# Patient Record
Sex: Female | Born: 1980 | Race: Black or African American | Hispanic: No | Marital: Single | Smoking: Current every day smoker
Health system: Southern US, Community
[De-identification: ages and names within clinical notes are randomized; demographics above are authoritative.]

---

## 2015-01-12 ENCOUNTER — Encounter (HOSPITAL_COMMUNITY): Payer: Self-pay | Admitting: Emergency Medicine

## 2015-01-12 ENCOUNTER — Emergency Department (HOSPITAL_COMMUNITY)
Admission: EM | Admit: 2015-01-12 | Discharge: 2015-01-12 | Disposition: A | Discharge status: Home / Self Care | Payer: Medicaid Other | Attending: Emergency Medicine | Admitting: Emergency Medicine

## 2015-01-12 DIAGNOSIS — H9203 Otalgia, bilateral: Secondary | ICD-10-CM | POA: Insufficient documentation

## 2015-01-12 DIAGNOSIS — H748X3 Other specified disorders of middle ear and mastoid, bilateral: Secondary | ICD-10-CM | POA: Diagnosis not present

## 2015-01-12 DIAGNOSIS — F17210 Nicotine dependence, cigarettes, uncomplicated: Secondary | ICD-10-CM | POA: Diagnosis not present

## 2015-01-12 DIAGNOSIS — J029 Acute pharyngitis, unspecified: Secondary | ICD-10-CM | POA: Insufficient documentation

## 2015-01-12 DIAGNOSIS — R05 Cough: Secondary | ICD-10-CM

## 2015-01-12 DIAGNOSIS — R059 Cough, unspecified: Secondary | ICD-10-CM

## 2015-01-12 MED ORDER — AMOXICILLIN 500 MG PO CAPS: 500.0000 mg | ORAL_CAPSULE | Freq: Three times a day (TID) | ORAL | Status: AC

## 2015-01-12 NOTE — ED Provider Notes (Signed)
CSN: [id number]     Arrival date & time 01/12/15  1853 History  By signing my name below, I, Freida Busman, attest that this documentation has been prepared under the direction and in the presence of non-physician practitioner, Sharilyn Sites, PA-C. Electronically Signed: Freida Busman, Scribe. 01/12/2015. 7:33 PM.   Chief Complaint  Patient presents with  . Sore Throat   The history is provided by the patient. No language interpreter was used.    HPI Comments:  Kara Coleman is a 34 y.o. female who presents to the Emergency Department complaining of sore throat with moderate pain, onset a few days ago. She reports associated ear pain and cough, occasionally productive with sputum. Pt also notes multiple sick contacts at home. She denies fever. No alleviating factors noted.  Patient is a daily smoker.  Slight tachycardia noted.  History reviewed. No pertinent past medical history. History reviewed. No pertinent past surgical history. No family history on file. Social History  Substance Use Topics  . Smoking status: Current Every Day Smoker    Types: Cigarettes  . Smokeless tobacco: None  . Alcohol Use: No   OB History    No data available     Review of Systems  Constitutional: Negative for fever and chills.  HENT: Positive for ear pain and sore throat.   Respiratory: Positive for cough. Negative for shortness of breath.   Cardiovascular: Negative for chest pain.  All other systems reviewed and are negative.   Allergies  Review of patient's allergies indicates no known allergies.  Home Medications   Prior to Admission medications   Medication Sig Start Date End Date Taking? Authorizing Provider  amoxicillin (AMOXIL) 500 MG capsule Take 1 capsule (500 mg total) by mouth 3 (three) times daily. 01/12/15   Garlon Hatchet, PA-C   BP 130/79 mmHg  Pulse 122  Temp(Src) 98.6 F (37 C) (Oral)  Resp 18  SpO2 95%  LMP 12/20/2014 (Approximate)   Physical Exam  Constitutional:  She is oriented to person, place, and time. She appears well-developed and well-nourished. No distress.  HENT:  Head: Normocephalic and atraumatic.  Right Ear: Tympanic membrane is erythematous. A middle ear effusion is present.  Left Ear: Tympanic membrane is erythematous. A middle ear effusion is present.  Nose: Nose normal.  Mouth/Throat: Uvula is midline and mucous membranes are normal. Normal dentition. Posterior oropharyngeal erythema present.  Tonsils 2+ bilaterally with exudates noted; uvula midline without peritonsillar abscess; handling secretions appropriately; no difficulty swallowing or speaking EAC's and TM's erythematous bilaterally; no TM rupture or mastoid tenderness  Eyes: Conjunctivae and EOM are normal. Pupils are equal, round, and reactive to light.  Neck: Normal range of motion. Neck supple.  Cardiovascular: Normal rate, regular rhythm and normal heart sounds.   Pulmonary/Chest: Effort normal and breath sounds normal. She has no decreased breath sounds. She has no wheezes. She has no rhonchi.  Musculoskeletal: Normal range of motion. She exhibits no edema.  Neurological: She is alert and oriented to person, place, and time.  Skin: Skin is warm and dry. She is not diaphoretic.  Psychiatric: She has a normal mood and affect.  Nursing note and vitals reviewed.   ED Course  Procedures   DIAGNOSTIC STUDIES:  Oxygen Saturation is 95% on RA, normal by my interpretation.    COORDINATION OF CARE:  7:30 PM Discussed treatment plan with pt at bedside and pt agreed to plan.   MDM   Final diagnoses:  Sore throat  Ear pain, bilateral  Cough   34 year old female here with sore throat, bilateral ear pain, and intermittently productive cough. Patient is afebrile, nontoxic.  She is somewhat tachycardic but denies chest pain or shortness of breath. Her lungs are clear without wheezes or rhonchi to suggest pneumonia. Her tonsils are enlarged bilaterally with exudates noted.  She also has findings concerning for otitis media. Patient be started on amoxicillin for coverage of both.  Discussed plan with patient, he/she acknowledged understanding and agreed with plan of care.  Return precautions given for new or worsening symptoms.  I personally performed the services described in this documentation, which was scribed in my presence. The recorded information has been reviewed and is accurate.  Garlon HatchetLisa M Mazzy Santarelli, PA-C 01/12/15 2111  Geoffery Lyonsouglas Delo, MD 01/12/15 [phone]

## 2015-01-12 NOTE — ED Notes (Signed)
Pt arrives POV with c/o of sore throat, cough and ear pain. States she originally thought it was an ear infection, but developed cough - intermittently productive

## 2015-01-12 NOTE — ED Notes (Signed)
Patient left at this time with all belongings. 

## 2015-01-12 NOTE — Discharge Instructions (Signed)
Take the prescribed medication as directed. °Return to the ED for new or worsening symptoms. ° °

## 2015-02-17 ENCOUNTER — Emergency Department (HOSPITAL_COMMUNITY)
Admission: EM | Admit: 2015-02-17 | Discharge: 2015-02-17 | Disposition: A | Discharge status: Home / Self Care | Payer: Medicaid Other | Attending: Emergency Medicine | Admitting: Emergency Medicine

## 2015-02-17 ENCOUNTER — Encounter (HOSPITAL_COMMUNITY): Payer: Self-pay | Admitting: Emergency Medicine

## 2015-02-17 DIAGNOSIS — K0889 Other specified disorders of teeth and supporting structures: Secondary | ICD-10-CM | POA: Insufficient documentation

## 2015-02-17 DIAGNOSIS — Z792 Long term (current) use of antibiotics: Secondary | ICD-10-CM | POA: Insufficient documentation

## 2015-02-17 DIAGNOSIS — R51 Headache: Secondary | ICD-10-CM | POA: Insufficient documentation

## 2015-02-17 DIAGNOSIS — K029 Dental caries, unspecified: Secondary | ICD-10-CM | POA: Diagnosis not present

## 2015-02-17 DIAGNOSIS — F17210 Nicotine dependence, cigarettes, uncomplicated: Secondary | ICD-10-CM | POA: Insufficient documentation

## 2015-02-17 MED ORDER — HYDROCODONE-ACETAMINOPHEN 5-325 MG PO TABS: 1.0000 | ORAL_TABLET | Freq: Four times a day (QID) | ORAL | Status: DC | PRN

## 2015-02-17 MED ORDER — HYDROCODONE-ACETAMINOPHEN 5-325 MG PO TABS
1.0000 | ORAL_TABLET | Freq: Once | ORAL | Status: AC
  Administered 2015-02-17: 1 via ORAL
  Filled 2015-02-17: qty 1

## 2015-02-17 MED ORDER — BUPIVACAINE-EPINEPHRINE (PF) 0.5% -1:200000 IJ SOLN
1.8000 mL | Freq: Once | INTRAMUSCULAR | Status: AC
  Administered 2015-02-17: 1.8 mL

## 2015-02-17 MED ORDER — PENICILLIN V POTASSIUM 500 MG PO TABS: 500.0000 mg | ORAL_TABLET | Freq: Three times a day (TID) | ORAL | Status: AC

## 2015-02-17 NOTE — ED Notes (Signed)
Pt c/o of dental pain. Swishing cold water continuously. Symptoms have been continuous off and on for several month. Worsening progression over last 4 days. Went to dentist 2 years ago for the same problem. She was told that she has to get all of her back teeth taken out but that she had to stop smoking first. Pt states she is ready to quit. Painful at the top and the bottom.

## 2015-02-17 NOTE — Discharge Instructions (Signed)
Please follow up closely with dentist for further management of your dental pain.  Take antibiotic, pain medication and ibuprofen for your symptoms.    Dental Caries Dental caries (also called tooth decay) is the most common oral disease. It can occur at any age but is more common in children and young adults.  HOW DENTAL CARIES DEVELOPS  The process of decay begins when bacteria and foods (particularly sugars and starches) combine in your mouth to produce plaque. Plaque is a substance that sticks to the hard, outer surface of a tooth (enamel). The bacteria in plaque produce acids that attack enamel. These acids may also attack the root surface of a tooth (cementum) if it is exposed. Repeated attacks dissolve these surfaces and create holes in the tooth (cavities). If left untreated, the acids destroy the other layers of the tooth.  RISK FACTORS  Frequent sipping of sugary beverages.   Frequent snacking on sugary and starchy foods, especially those that easily get stuck in the teeth.   Poor oral hygiene.   Dry mouth.   Substance abuse such as methamphetamine abuse.   Broken or poor-fitting dental restorations.   Eating disorders.   Gastroesophageal reflux disease (GERD).   Certain radiation treatments to the head and neck. SYMPTOMS In the early stages of dental caries, symptoms are seldom present. Sometimes white, chalky areas may be seen on the enamel or other tooth layers. In later stages, symptoms may include:  Pits and holes on the enamel.  Toothache after sweet, hot, or cold foods or drinks are consumed.  Pain around the tooth.  Swelling around the tooth. DIAGNOSIS  Most of the time, dental caries is detected during a regular dental checkup. A diagnosis is made after a thorough medical and dental history is taken and the surfaces of your teeth are checked for signs of dental caries. Sometimes special instruments, such as lasers, are used to check for dental caries.  Dental X-ray exams may be taken so that areas not visible to the eye (such as between the contact areas of the teeth) can be checked for cavities.  TREATMENT  If dental caries is in its early stages, it may be reversed with a fluoride treatment or an application of a remineralizing agent at the dental office. Thorough brushing and flossing at home is needed to aid these treatments. If it is in its later stages, treatment depends on the location and extent of tooth destruction:   If a small area of the tooth has been destroyed, the destroyed area will be removed and cavities will be filled with a material such as gold, silver amalgam, or composite resin.   If a large area of the tooth has been destroyed, the destroyed area will be removed and a cap (crown) will be fitted over the remaining tooth structure.   If the center part of the tooth (pulp) is affected, a procedure called a root canal will be needed before a filling or crown can be placed.   If most of the tooth has been destroyed, the tooth may need to be pulled (extracted). HOME CARE INSTRUCTIONS You can prevent, stop, or reverse dental caries at home by practicing good oral hygiene. Good oral hygiene includes:  Thoroughly cleaning your teeth at least twice a day with a toothbrush and dental floss.   Using a fluoride toothpaste. A fluoride mouth rinse may also be used if recommended by your dentist or health care provider.   Restricting the amount of sugary and starchy  foods and sugary liquids you consume.   Avoiding frequent snacking on these foods and sipping of these liquids.   Keeping regular visits with a dentist for checkups and cleanings. PREVENTION   Practice good oral hygiene.  Consider a dental sealant. A dental sealant is a coating material that is applied by your dentist to the pits and grooves of teeth. The sealant prevents food from being trapped in them. It may protect the teeth for several years.  Ask about  fluoride supplements if you live in a community without fluorinated water or with water that has a low fluoride content. Use fluoride supplements as directed by your dentist or health care provider.  Allow fluoride varnish applications to teeth if directed by your dentist or health care provider.   This information is not intended to replace advice given to you by your health care provider. Make sure you discuss any questions you have with your health care provider.   Document Released: 10/27/2001 Document Revised: 02/25/2014 Document Reviewed: 02/07/2012 Elsevier Interactive Patient Education [employer]

## 2015-02-17 NOTE — ED Provider Notes (Signed)
CSN: [id number]   Arrival date & time 02/17/15 2253  History  By signing my name below, I, Bethel Born, attest that this documentation has been prepared under the direction and in the presence of  Fayrene Helper PA-C Electronically Signed: Bethel Born, ED Scribe. 02/17/2015. 11:46 PM. Chief Complaint  Patient presents with  . Dental Pain    HPI The history is provided by the patient. No language interpreter was used.   Kara Coleman is a 34 y.o. female who presents to the Emergency Department complaining of ongoing, throbbing, right upper and lower dental pain with onset 2 months ago.  Gargling with cold water improves the pain. Heat exacerbates the pain. Advil provides some relief at home but the pain has worsened over the last 4 days. Associated symptoms include headache. Pt denies fever, trouble swallowing, rhinorrhea, and sneezing. Pt states that she was seen by a dentist more than 1 year ago where she was told that she needed to have several teeth removed but she had to quit smoking first. She plans to see her dentist again in the morning.  History reviewed. No pertinent past medical history.  History reviewed. No pertinent past surgical history.  No family history on file.  Social History  Substance Use Topics  . Smoking status: Current Every Day Smoker    Types: Cigarettes  . Smokeless tobacco: None  . Alcohol Use: No     Review of Systems  Constitutional: Negative for fever.  HENT: Positive for dental problem. Negative for rhinorrhea, sneezing and trouble swallowing.   Neurological: Positive for headaches.    Home Medications   Prior to Admission medications   Medication Sig Start Date End Date Taking? Authorizing Provider  amoxicillin (AMOXIL) 500 MG capsule Take 1 capsule (500 mg total) by mouth 3 (three) times daily. 01/12/15   Garlon Hatchet, PA-C  HYDROcodone-acetaminophen (NORCO/VICODIN) 5-325 MG tablet Take 1 tablet by mouth every 6 (six) hours as needed for  moderate pain. 02/17/15   Fayrene Helper, PA-C  penicillin v potassium (VEETID) 500 MG tablet Take 1 tablet (500 mg total) by mouth 3 (three) times daily. 02/17/15   Fayrene Helper, PA-C    Allergies  Review of patient's allergies indicates no known allergies.  Triage Vitals: BP 184/92 mmHg  Pulse 92  Temp(Src) 98.3 F (36.8 C) (Axillary)  Resp 24  SpO2 100%  Physical Exam  Constitutional: She is oriented to person, place, and time. She appears well-developed and well-nourished. No distress.  HENT:  Head: Normocephalic and atraumatic.  Significant dental decay noted at tooth #2, #30, and #31 with tenderness to palpation. No gingival erythema. No obvious abscess. No trismus.   Eyes: Conjunctivae and EOM are normal.  Neck: Neck supple. No tracheal deviation present.  Cardiovascular: Normal rate.   Pulmonary/Chest: Effort normal. No respiratory distress.  Musculoskeletal: Normal range of motion.  Lymphadenopathy:    She has no cervical adenopathy.  Neurological: She is alert and oriented to person, place, and time.  Skin: Skin is warm and dry.  Psychiatric: She has a normal mood and affect. Her behavior is normal.  Nursing note and vitals reviewed.   ED Course  Procedures  Dental Block Procedure Note Performed by:Fayrene Helper PA-C at 11:38 PM Consent obtained.  Required items: required blood products, implants, devices, and special equipment available. Time out: Immediately prior to the procedure a "time out" was called to verify the correct patient, procedure, equipment, support staff, and site/side marked as required. Indication: Pain improvement Nerve  block body site: Inferior alveolar Needle gauge: 27 Location technique:anatomical landmarks Local anesthetic: Marcaine Anesthetic total:1.8 ml Outcome: Pain improvement Patient tolerance: Patient tolerated the procedure well with no immediate complications.   DIAGNOSTIC STUDIES: Oxygen Saturation is 100% on RA,  normal by my  interpretation.    COORDINATION OF CARE: 11:33 PM Discussed treatment plan which includes dental block and discharge with abx to f/u with her dentist with pt at bedside and pt agreed to the plan.  MDM  Patient with dentalgia. Dental block performed in the ED. No abscess requiring immediate incision and drainage.  Exam not concerning for Ludwig's angina or pharyngeal abscess.  Will treat with penicillin v potassium . Pt instructed to follow-up with dentist.  Discussed return precautions. Pt safe for discharge.  Final diagnoses:  Pain due to dental caries    BP 165/85 mmHg  Pulse 88  Temp(Src) 98.3 F (36.8 C) (Axillary)  Resp 22  Ht 5\' 4"  (1.626 m)  Wt 84.369 kg  BMI 31.91 kg/m2  SpO2 100% . I personally performed the services described in this documentation, which was scribed in my presence. The recorded information has been reviewed and is accurate.        Fayrene HelperBowie Albana Saperstein, PA-C 02/17/15 2351  Mancel BaleElliott Wentz, MD 02/18/15 [phone]

## 2015-07-17 ENCOUNTER — Encounter (HOSPITAL_COMMUNITY): Payer: Self-pay | Admitting: Emergency Medicine

## 2015-07-17 ENCOUNTER — Emergency Department (HOSPITAL_COMMUNITY)
Admission: EM | Admit: 2015-07-17 | Discharge: 2015-07-17 | Disposition: A | Discharge status: Home / Self Care | Payer: Medicaid Other | Attending: Emergency Medicine | Admitting: Emergency Medicine

## 2015-07-17 ENCOUNTER — Emergency Department (HOSPITAL_COMMUNITY): Payer: Medicaid Other

## 2015-07-17 DIAGNOSIS — Y9289 Other specified places as the place of occurrence of the external cause: Secondary | ICD-10-CM | POA: Diagnosis not present

## 2015-07-17 DIAGNOSIS — W010XXA Fall on same level from slipping, tripping and stumbling without subsequent striking against object, initial encounter: Secondary | ICD-10-CM | POA: Diagnosis not present

## 2015-07-17 DIAGNOSIS — F17210 Nicotine dependence, cigarettes, uncomplicated: Secondary | ICD-10-CM | POA: Insufficient documentation

## 2015-07-17 DIAGNOSIS — S82832A Other fracture of upper and lower end of left fibula, initial encounter for closed fracture: Secondary | ICD-10-CM | POA: Diagnosis not present

## 2015-07-17 DIAGNOSIS — Y9302 Activity, running: Secondary | ICD-10-CM | POA: Insufficient documentation

## 2015-07-17 DIAGNOSIS — Z792 Long term (current) use of antibiotics: Secondary | ICD-10-CM | POA: Diagnosis not present

## 2015-07-17 DIAGNOSIS — S99912A Unspecified injury of left ankle, initial encounter: Secondary | ICD-10-CM | POA: Diagnosis present

## 2015-07-17 DIAGNOSIS — Y998 Other external cause status: Secondary | ICD-10-CM | POA: Insufficient documentation

## 2015-07-17 DIAGNOSIS — S82402A Unspecified fracture of shaft of left fibula, initial encounter for closed fracture: Secondary | ICD-10-CM

## 2015-07-17 MED ORDER — HYDROCODONE-ACETAMINOPHEN 5-325 MG PO TABS: 2.0000 | ORAL_TABLET | ORAL | Status: AC | PRN

## 2015-07-17 NOTE — ED Notes (Signed)
Declined W/C at D/C and was escorted to lobby by RN. 

## 2015-07-17 NOTE — Discharge Instructions (Signed)
Cast or Splint Care °Casts and splints support injured limbs and keep bones from moving while they heal. It is important to care for your cast or splint at home.   °HOME CARE INSTRUCTIONS °· Keep the cast or splint uncovered during the drying period. It can take 24 to 48 hours to dry if it is made of plaster. A fiberglass cast will dry in less than 1 hour. °· Do not rest the cast on anything harder than a pillow for the first 24 hours. °· Do not put weight on your injured limb or apply pressure to the cast until your health care provider gives you permission. °· Keep the cast or splint dry. Wet casts or splints can lose their shape and may not support the limb as well. A wet cast that has lost its shape can also create harmful pressure on your skin when it dries. Also, wet skin can become infected. °· Cover the cast or splint with a plastic bag when bathing or when out in the rain or snow. If the cast is on the trunk of the body, take sponge baths until the cast is removed. °· If your cast does become wet, dry it with a towel or a blow dryer on the cool setting only. °· Keep your cast or splint clean. Soiled casts may be wiped with a moistened cloth. °· Do not place any hard or soft foreign objects under your cast or splint, such as cotton, toilet paper, lotion, or powder. °· Do not try to scratch the skin under the cast with any object. The object could get stuck inside the cast. Also, scratching could lead to an infection. If itching is a problem, use a blow dryer on a cool setting to relieve discomfort. °· Do not trim or cut your cast or remove padding from inside of it. °· Exercise all joints next to the injury that are not immobilized by the cast or splint. For example, if you have a long leg cast, exercise the hip joint and toes. If you have an arm cast or splint, exercise the shoulder, elbow, thumb, and fingers. °· Elevate your injured arm or leg on 1 or 2 pillows for the first 1 to 3 days to decrease  swelling and pain. It is best if you can comfortably elevate your cast so it is higher than your heart. °SEEK MEDICAL CARE IF:  °· Your cast or splint cracks. °· Your cast or splint is too tight or too loose. °· You have unbearable itching inside the cast. °· Your cast becomes wet or develops a soft spot or area. °· You have a bad smell coming from inside your cast. °· You get an object stuck under your cast. °· Your skin around the cast becomes red or raw. °· You have new pain or worsening pain after the cast has been applied. °SEEK IMMEDIATE MEDICAL CARE IF:  °· You have fluid leaking through the cast. °· You are unable to move your fingers or toes. °· You have discolored (blue or white), cool, painful, or very swollen fingers or toes beyond the cast. °· You have tingling or numbness around the injured area. °· You have severe pain or pressure under the cast. °· You have any difficulty with your breathing or have shortness of breath. °· You have chest pain. °  °This information is not intended to replace advice given to you by your health care provider. Make sure you discuss any questions you have with your health care   provider. °  °Document Released: 02/02/2000 Document Revised: 11/25/2012 Document Reviewed: 08/13/2012 °Elsevier Interactive Patient Education ©2016 [employer] ° °Fibular Ankle Fracture Treated With or Without Immobilization, Adult °A fibular fracture at your ankle is a break (fracture) bone in the smallest of the two bones in your lower leg, located on the outside of your leg (fibula) close to the area at your ankle joint. °CAUSES °· Rolling your ankle. °· Twisting your ankle. °· Extreme flexing or extending of your foot. °· Severe force on your ankle as when falling from a distance. °RISK FACTORS °· Jumping activities. °· Participation in sports. °· Osteoporosis. °· Advanced age. °· Previous ankle injuries. °SIGNS AND SYMPTOMS °· Pain. °· Swelling. °· Inability to put weight on injured  ankle. °· Bruising. °· Bone deformities at site of injury. °DIAGNOSIS  °This fracture is diagnosed with the help of an X-ray exam. °TREATMENT  °If the fractured bone did not move out of place it usually will heal without problems and does casting or splinting. If immobilization is needed for comfort or the fractured bone moved out of place and will not heal properly with immobilization, a cast or splint will be used. °HOME CARE INSTRUCTIONS  °· Apply ice to the area of injury: °¨ Put ice in a plastic bag. °¨ Place a towel between your skin and the bag. °¨ Leave the ice on for 20 minutes, 2-3 times a day. °· Use crutches as directed. Resume walking without crutches as directed by your health care provider. °· Only take over-the-counter or prescription medicines for pain, discomfort, or fever as directed by your health care provider. °· If you have a removable splint or boot, do not remove the boot unless directed by your health care provider. °SEEK MEDICAL CARE IF:  °· You have continued pain or more swelling °· The medications do not control the pain. °SEEK IMMEDIATE MEDICAL CARE IF: °· You develop severe pain in the leg or foot. °· Your skin or nails below the injury turn blue or grey or feel cold or numb. °MAKE SURE YOU:  °· Understand these instructions. °· Will watch your condition. °· Will get help right away if you are not doing well or get worse. °  °This information is not intended to replace advice given to you by your health care provider. Make sure you discuss any questions you have with your health care provider. °  °Document Released: 02/04/2005 Document Revised: 02/25/2014 Document Reviewed: 09/16/2012 °Elsevier Interactive Patient Education ©2016 [employer] ° °

## 2015-07-17 NOTE — ED Notes (Signed)
Pt reports that she was running when she fell causing injury to left ankle.

## 2015-07-17 NOTE — ED Provider Notes (Signed)
CSN: [id number]     Arrival date & time 07/17/15  [id number] History   First MD Initiated Contact with Patient 07/17/15 [phone]     Chief Complaint  Patient presents with  . Fall  . Ankle Pain   HPI   35 year old female presents today with complaints of ankle pain. Patient reports that she was walking when she slipped on water approximately 5 days ago. Patient reports that she thought she just sprained her ankle, and has been able to ambulate but with pain since then. She notes swelling to the lateral aspect of the ankle, denies any loss of distal sensation strength or motor function. Patient reports that her range of motion is decreased due to the swelling, she denies any other injuries, denies any proximal fibular injury. No history of the same, patient has been using ibuprofen for pain.  History reviewed. No pertinent past medical history. History reviewed. No pertinent past surgical history. No family history on file. Social History  Substance Use Topics  . Smoking status: Current Every Day Smoker    Types: Cigarettes  . Smokeless tobacco: None  . Alcohol Use: No   OB History    No data available     Review of Systems  All other systems reviewed and are negative.   Allergies  Review of patient's allergies indicates no known allergies.  Home Medications   Prior to Admission medications   Medication Sig Start Date End Date Taking? Authorizing Provider  amoxicillin (AMOXIL) 500 MG capsule Take 1 capsule (500 mg total) by mouth 3 (three) times daily. 01/12/15   Garlon HatchetLisa M Sanders, PA-C  HYDROcodone-acetaminophen (NORCO/VICODIN) 5-325 MG tablet Take 2 tablets by mouth every 4 (four) hours as needed. 07/17/15   Eyvonne MechanicJeffrey Inaki Vantine, PA-C  penicillin v potassium (VEETID) 500 MG tablet Take 1 tablet (500 mg total) by mouth 3 (three) times daily. 02/17/15   Fayrene HelperBowie Tran, PA-C   BP 129/93 mmHg  Pulse 78  Temp(Src) 98.6 F (37 C) (Oral)  Resp 18  Ht 5\' 4"  (1.626 m)  Wt 92.987 kg  BMI 35.17 kg/m2   SpO2 100%  LMP 06/26/2015   Physical Exam  Constitutional: She is oriented to person, place, and time. She appears well-developed and well-nourished.  HENT:  Head: Normocephalic and atraumatic.  Eyes: Conjunctivae are normal. Pupils are equal, round, and reactive to light. Right eye exhibits no discharge. Left eye exhibits no discharge. No scleral icterus.  Neck: Normal range of motion. No JVD present. No tracheal deviation present.  Pulmonary/Chest: Effort normal. No stridor.  Musculoskeletal: Normal range of motion.  Obvious swelling to the left lateral ankle, no obvious laxity; difficult exam due to swelling and pain. No perineal involvement, full sensation, knee nontender to palpation, lower extremity nontender no swelling. Patient ambulates with some difficulty. Pedal pulses 2+  Neurological: She is alert and oriented to person, place, and time. Coordination normal.  Psychiatric: She has a normal mood and affect. Her behavior is normal. Judgment and thought content normal.  Nursing note and vitals reviewed.   ED Course  Procedures (including critical care time) Labs Review Labs Reviewed - No data to display  Imaging Review Dg Ankle Complete Left  07/17/2015  CLINICAL DATA:  Twisting injury 4 days ago with persistent pain, initial encounter EXAM: LEFT ANKLE COMPLETE - 3+ VIEW COMPARISON:  None. FINDINGS: Generalized soft tissue swelling is noted. An undisplaced oblique fracture of the distal fibula is noted. No other fractures are noted. IMPRESSION: Distal fibular fracture with associated  soft tissue swelling. Electronically Signed   By: Alcide Clever M.D.   On: 07/17/2015 09:01   I have personally reviewed and evaluated these images and lab results as part of my medical decision-making.   EKG Interpretation None      MDM   Final diagnoses:  Closed fibular fracture, left, initial encounter    Labs:  Imaging:DG ankle complete: Distal fibular fracture with associated soft  tissue swelling  Consults:  Therapeutics:  Discharge Meds: Hydrocodone  Assessment/Plan: 35 year old female presents today with fibular fracture, no signs of intra-articular, no displacement, no perineal involvement, no other injuries noted. Patient placed in stirrup splint, given crutches, pain medication as needed, follow up with [place] [facility] this week for reevaluation. Strict return precautions given        Eyvonne Mechanic, PA-C 07/17/15 1353  Rolland Porter, MD 07/27/15 [phone]

## 2015-07-17 NOTE — Progress Notes (Signed)
Orthopedic Tech Progress Note Patient Details:  Kara Coleman 04/09/1980 [id number]  Ortho Devices Type of Ortho Device: Ace wrap, Post (short leg) splint, Stirrup splint, Crutches Ortho Device/Splint Interventions: Application   Saul FordyceJennifer C Deanette Tullius 07/17/2015, 10:09 AM

## 2017-06-08 IMAGING — CR DG ANKLE COMPLETE 3+V*L*
3 series · 3 of 3 positions shown · non-contrast
Comparison: None.

CLINICAL DATA: Twisting injury 4 days ago with persistent pain,
initial encounter

EXAM:
LEFT ANKLE COMPLETE - 3+ VIEW

[ankle ap]
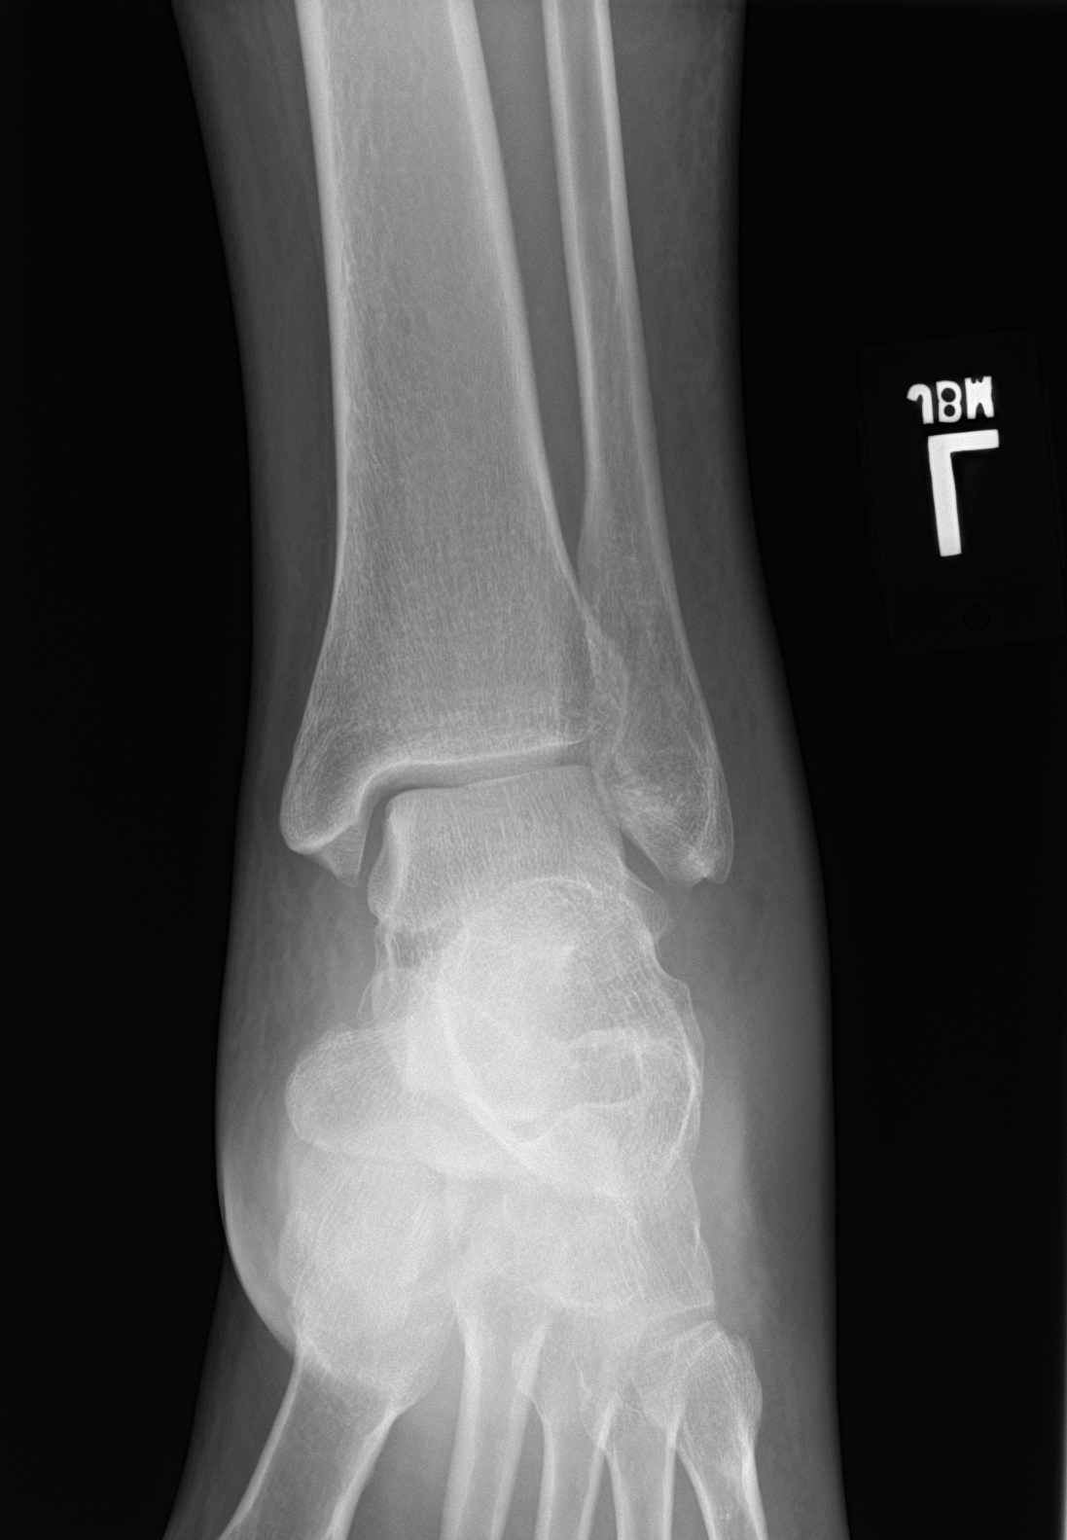

[ankle obl]
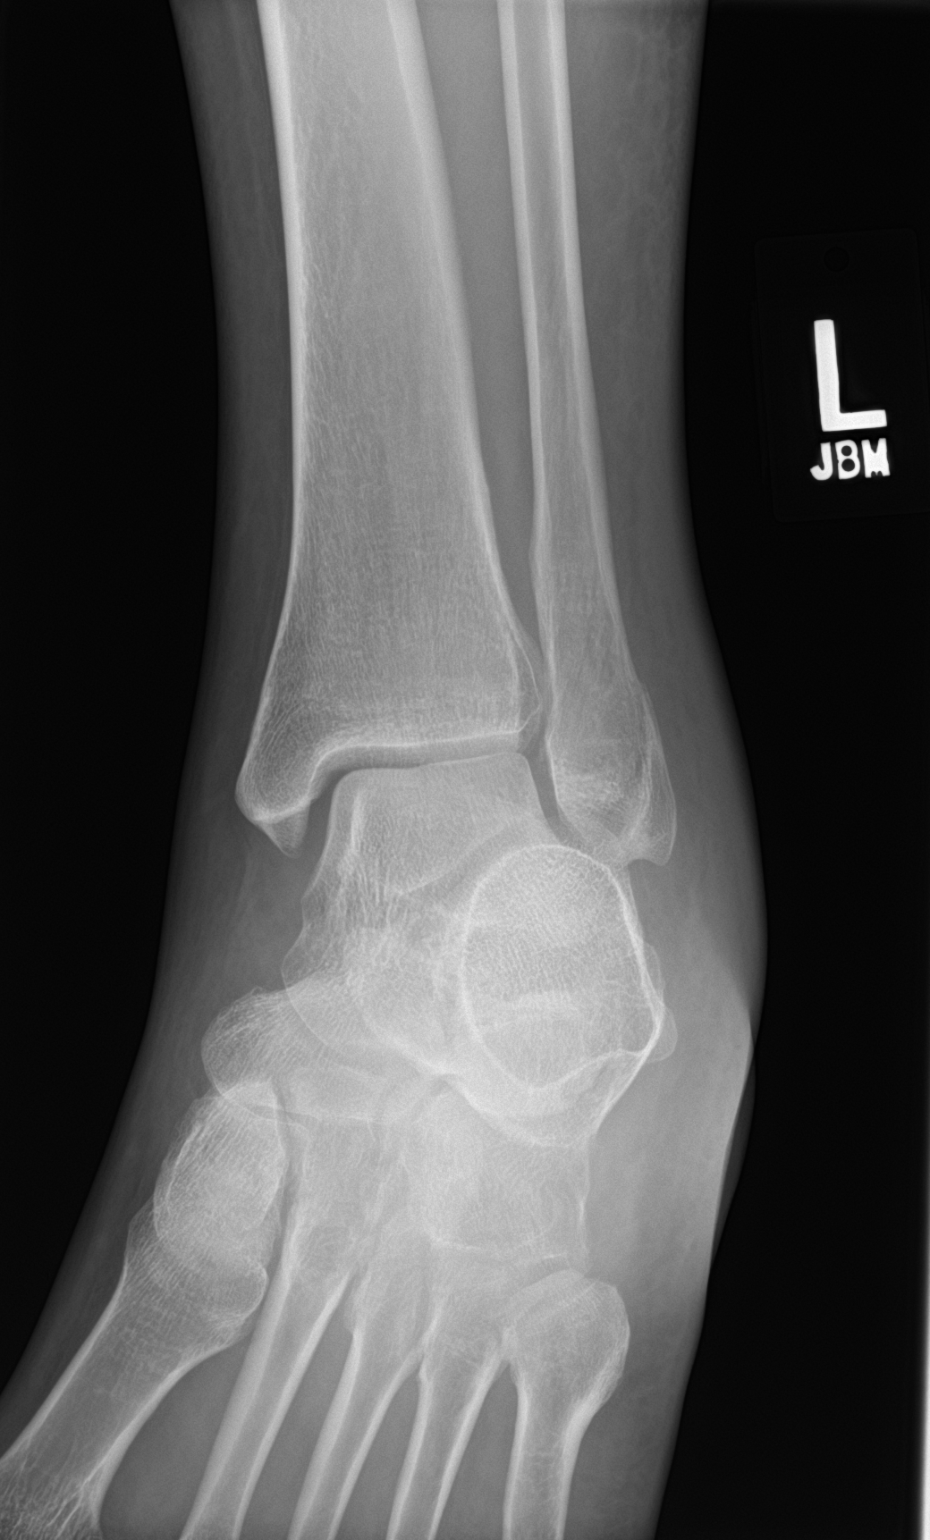

[ankle lat]
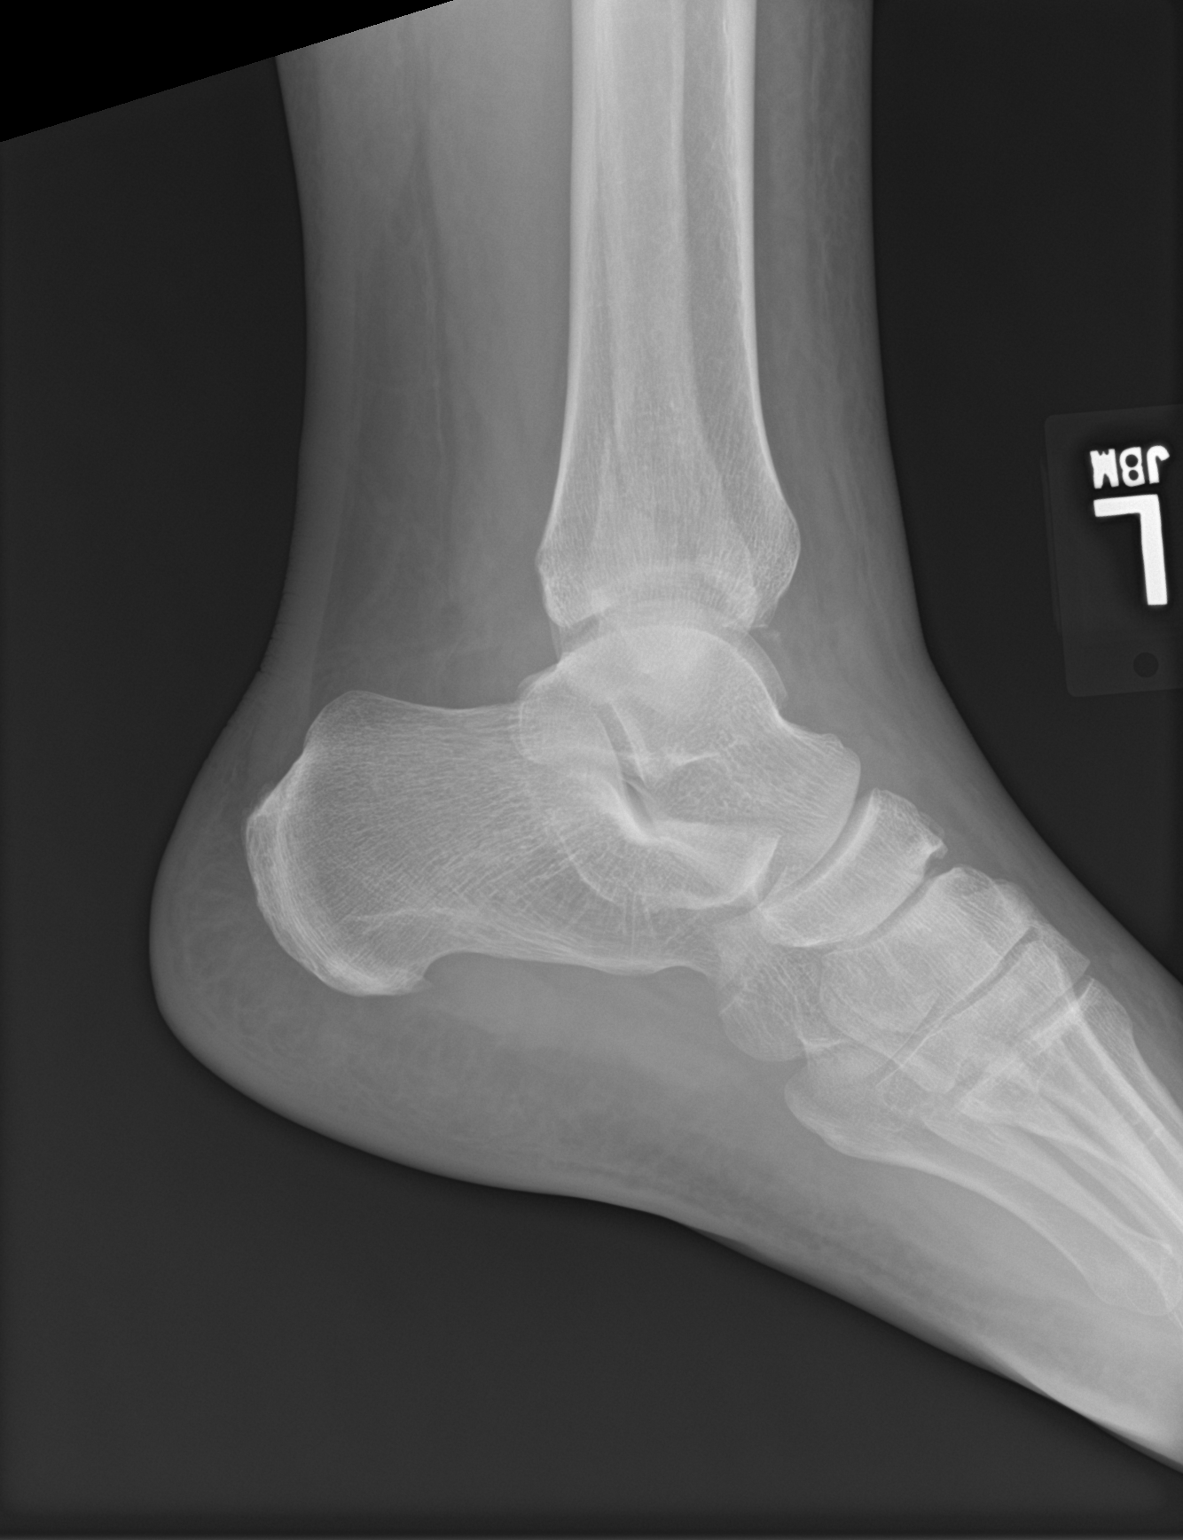

[3 of 3 positions shown; findings below may reference images not displayed]

FINDINGS: Generalized soft tissue swelling is noted. An undisplaced oblique
fracture of the distal fibula is noted. No other fractures are
noted.
IMPRESSION: Distal fibular fracture with associated soft tissue swelling.

## 2020-10-12 ENCOUNTER — Encounter (HOSPITAL_BASED_OUTPATIENT_CLINIC_OR_DEPARTMENT_OTHER): Payer: Self-pay | Admitting: Emergency Medicine

## 2020-10-12 ENCOUNTER — Other Ambulatory Visit: Payer: Self-pay

## 2020-10-12 ENCOUNTER — Emergency Department (HOSPITAL_BASED_OUTPATIENT_CLINIC_OR_DEPARTMENT_OTHER)
Admission: EM | Admit: 2020-10-12 | Discharge: 2020-10-12 | Disposition: A | Discharge status: Home / Self Care | Payer: Worker's Compensation | Attending: Emergency Medicine | Admitting: Emergency Medicine

## 2020-10-12 DIAGNOSIS — F17210 Nicotine dependence, cigarettes, uncomplicated: Secondary | ICD-10-CM | POA: Insufficient documentation

## 2020-10-12 DIAGNOSIS — S61211A Laceration without foreign body of left index finger without damage to nail, initial encounter: Secondary | ICD-10-CM | POA: Diagnosis not present

## 2020-10-12 DIAGNOSIS — W260XXA Contact with knife, initial encounter: Secondary | ICD-10-CM | POA: Insufficient documentation

## 2020-10-12 DIAGNOSIS — S60941A Unspecified superficial injury of left index finger, initial encounter: Secondary | ICD-10-CM | POA: Diagnosis present

## 2020-10-12 DIAGNOSIS — Z23 Encounter for immunization: Secondary | ICD-10-CM | POA: Insufficient documentation

## 2020-10-12 DIAGNOSIS — Y9389 Activity, other specified: Secondary | ICD-10-CM | POA: Diagnosis not present

## 2020-10-12 DIAGNOSIS — Y9289 Other specified places as the place of occurrence of the external cause: Secondary | ICD-10-CM | POA: Diagnosis not present

## 2020-10-12 MED ORDER — LIDOCAINE HCL (PF) 1 % IJ SOLN
5.0000 mL | Freq: Once | INTRAMUSCULAR | Status: AC
  Administered 2020-10-12: 5 mL
  Filled 2020-10-12: qty 5

## 2020-10-12 MED ORDER — TETANUS-DIPHTH-ACELL PERTUSSIS 5-2.5-18.5 LF-MCG/0.5 IM SUSY
0.5000 mL | PREFILLED_SYRINGE | Freq: Once | INTRAMUSCULAR | Status: AC
  Administered 2020-10-12: 0.5 mL via INTRAMUSCULAR
  Filled 2020-10-12: qty 0.5

## 2020-10-12 NOTE — Discharge Instructions (Addendum)
Please follow up with health care professional for suture removal in 6-7 days. You had 3 sutures placed.

## 2020-10-12 NOTE — ED Triage Notes (Signed)
Pt presents to ED POV. Pt c/o lac to L pointer finger. Pt reports that she tried to catch knife at work. Pt not UTD on tetanus. Bleeding still active but slow. Dressing changed in triage

## 2020-10-12 NOTE — ED Provider Notes (Addendum)
[facility] EMERGENCY DEPT Provider Note   CSN: [id number] Arrival date & time: 10/12/20  1820     History Chief Complaint  Patient presents with   Finger Injury    Kara Coleman is a 40 y.o. female.  Patient is a 40 yo female presenting to ED for finger laceration. Patient states directly prior to arrival she cut her left index finger with a chef knife. Admits to bleeding. Denies sensation or motor loss. Unknown Tdap status.   The history is provided by the patient. No language interpreter was used.  Hand Pain This is a new problem. The current episode started less than 1 hour ago. The problem occurs constantly. The problem has been gradually improving. Pertinent negatives include no chest pain and no shortness of breath. Nothing aggravates the symptoms.      History reviewed. No pertinent past medical history.  There are no problems to display for this patient.   History reviewed. No pertinent surgical history.   OB History   No obstetric history on file.     History reviewed. No pertinent family history.  Social History   Tobacco Use   Smoking status: Every Day    Types: Cigarettes  Substance Use Topics   Alcohol use: No   Drug use: Yes    Types: Marijuana    Home Medications Prior to Admission medications   Medication Sig Start Date End Date Taking? Authorizing Provider  amoxicillin (AMOXIL) 500 MG capsule Take 1 capsule (500 mg total) by mouth 3 (three) times daily. 01/12/15   Garlon Hatchet, PA-C  HYDROcodone-acetaminophen (NORCO/VICODIN) 5-325 MG tablet Take 2 tablets by mouth every 4 (four) hours as needed. 07/17/15   Hedges, Tinnie Gens, PA-C  penicillin v potassium (VEETID) 500 MG tablet Take 1 tablet (500 mg total) by mouth 3 (three) times daily. 02/17/15   Fayrene Helper, PA-C    Allergies    Patient has no known allergies.  Review of Systems   Review of Systems  Constitutional:  Negative for chills and fever.  Respiratory:  Negative  for chest tightness and shortness of breath.   Cardiovascular:  Negative for chest pain and palpitations.  Skin:  Positive for wound. Negative for color change.  Neurological:  Negative for weakness and numbness.   Physical Exam Updated Vital Signs BP (!) 154/104 (BP Location: Right Arm)   Pulse 75   Temp 98.1 F (36.7 C) (Oral)   Resp 18   Ht 5\' 5"  (1.651 m)   Wt 95.3 kg   LMP 10/02/2020   SpO2 100%   BMI 34.95 kg/m   Physical Exam Vitals and nursing note reviewed.  Constitutional:      Appearance: Normal appearance.  HENT:     Head: Normocephalic and atraumatic.  Cardiovascular:     Rate and Rhythm: Normal rate and regular rhythm.  Pulmonary:     Effort: Pulmonary effort is normal. No respiratory distress.  Skin:    Capillary Refill: Capillary refill takes less than 2 seconds.       Neurological:     General: No focal deficit present.     Mental Status: She is alert.     GCS: GCS eye subscore is 4. GCS verbal subscore is 5. GCS motor subscore is 6.     Sensory: Sensation is intact.     Motor: Motor function is intact.    ED Results / Procedures / Treatments   Labs (all labs ordered are listed, but only abnormal results are displayed)  Labs Reviewed - No data to display  EKG None  Radiology No results found.  Procedures .Marland KitchenLaceration Repair  Date/Time: 10/12/2020 9:02 PM Performed by: Franne Forts, DO Authorized by: Franne Forts, DO   Consent:    Consent obtained:  Verbal   Risks, benefits, and alternatives were discussed: yes   Universal protocol:    Procedure explained and questions answered to patient or proxy's satisfaction: yes     Immediately prior to procedure, a time out was called: yes     Patient identity confirmed:  Verbally with patient, arm band and hospital-assigned identification number Anesthesia:    Anesthesia method:  Local infiltration   Local anesthetic:  Lidocaine 1% w/o epi Laceration details:    Location:  Finger   Finger  location:  L index finger   Length (cm):  2 Skin repair:    Repair method:  Sutures   Suture technique:  Simple interrupted   Number of sutures:  2 Approximation:    Approximation:  Close Repair type:    Repair type:  Simple Post-procedure details:    Dressing:  Tube gauze   Procedure completion:  Tolerated   Medications Ordered in ED Medications  Tdap (BOOSTRIX) injection 0.5 mL (has no administration in time range)  lidocaine (PF) (XYLOCAINE) 1 % injection 5 mL (5 mLs Infiltration Given by Other 10/12/20 2017)    ED Course  I have reviewed the triage vital signs and the nursing notes.  Pertinent labs & imaging results that were available during my care of the patient were reviewed by me and considered in my medical decision making (see chart for details).    MDM Rules/Calculators/A&P                          40 yo female presenting to ED for finger laceration. Patient is Aox3, no acute distress, afebrile, with stable vitals. Physical exam demonstrates 2 cm laceration to dorsal surface of index finger on left hand. Bleeding controlled. No involvement of nail bed. Wound irrigated, no foreign bodies, pt declined xray, digital nerve block applied and 3 non-absorbable sutures placed.   Patient in no distress and overall condition improved here in the ED. Detailed discussions were had with the patient regarding current findings, and need for close f/u with PCP or health care professional for suture removal in 6-7 days.  The patient has been instructed to return immediately if the symptoms worsen in any way for re-evaluation. Patient verbalized understanding and is in agreement with current care plan. All questions answered prior to discharge.      Final Clinical Impression(s) / ED Diagnoses Final diagnoses:  Laceration of left index finger without foreign body without damage to nail, initial encounter    Rx / DC Orders ED Discharge Orders     None        Franne Forts,  DO 10/12/20 2101    Franne Forts, DO 10/12/20 2103

## 2020-11-06 ENCOUNTER — Encounter (HOSPITAL_COMMUNITY): Payer: Self-pay | Admitting: Emergency Medicine

## 2020-11-06 ENCOUNTER — Other Ambulatory Visit: Payer: Self-pay

## 2020-11-06 ENCOUNTER — Emergency Department (HOSPITAL_COMMUNITY)
Admission: EM | Admit: 2020-11-06 | Discharge: 2020-11-06 | Disposition: A | Discharge status: Home / Self Care | Payer: BC Managed Care – PPO | Attending: Emergency Medicine | Admitting: Emergency Medicine

## 2020-11-06 DIAGNOSIS — R0981 Nasal congestion: Secondary | ICD-10-CM | POA: Diagnosis present

## 2020-11-06 DIAGNOSIS — F17210 Nicotine dependence, cigarettes, uncomplicated: Secondary | ICD-10-CM | POA: Diagnosis not present

## 2020-11-06 DIAGNOSIS — U071 COVID-19: Secondary | ICD-10-CM | POA: Insufficient documentation

## 2020-11-06 DIAGNOSIS — Z20822 Contact with and (suspected) exposure to covid-19: Secondary | ICD-10-CM | POA: Diagnosis not present

## 2020-11-06 LAB — RESP PANEL BY RT-PCR (FLU A&B, COVID) ARPGX2
Influenza A by PCR: NEGATIVE
Influenza B by PCR: NEGATIVE
SARS Coronavirus 2 by RT PCR: POSITIVE — AB

## 2020-11-06 NOTE — ED Notes (Signed)
RN reviewed discharge instructions w/ pt. Follow up and pain management reviewed, pt had no further questions. 

## 2020-11-06 NOTE — ED Triage Notes (Signed)
Pt here from home for sinus pressure, chills and body aches that started yesterday morning. Pt reports some people at work have covid, she has had 1 shot, also says daughter has been sick w/ sore throat.

## 2020-11-06 NOTE — Discharge Instructions (Signed)
Please follow your results in MyChart.  Given that you have had known COVID contacts and are now having COVID-like symptoms even if this test is negative you need to assume you have COVID and quarantine appropriately.  Please take Tylenol (acetaminophen) to relieve your pain.  You may take tylenol, up to 1,000 mg (two extra strength pills).  Do not take more than 3,000 mg tylenol in a 24 hour period.  Please check all medication labels as many medications such as pain and cold medications may contain tylenol. Please do not drink alcohol while taking this medication.

## 2020-11-06 NOTE — ED Provider Notes (Signed)
[facility] EMERGENCY DEPARTMENT Provider Note   CSN: [id number] Arrival date & time: 11/06/20  1632     History Chief Complaint  Patient presents with   Generalized Body Aches    Kara Coleman is a 39 y.o. female with no pertinent past medical history who presents today for evaluation of body aches and nasal congestion.  She had a known COVID exposure with people at work having COVID.  She states her symptoms started yesterday.  She reports that she had body aches however that is improved.  Currently her only symptom is some nasal congestion.  She denies chest pain or significant shortness of breath.  No rashes.  No leg swelling.  She has had 1 COVID-vaccine.  HPI     History reviewed. No pertinent past medical history.  There are no problems to display for this patient.   History reviewed. No pertinent surgical history.   OB History   No obstetric history on file.     History reviewed. No pertinent family history.  Social History   Tobacco Use   Smoking status: Every Day    Packs/day: 1.00    Types: Cigarettes   Smokeless tobacco: Never  Substance Use Topics   Alcohol use: No   Drug use: Yes    Types: Marijuana    Home Medications Prior to Admission medications   Medication Sig Start Date End Date Taking? Authorizing Provider  amoxicillin (AMOXIL) 500 MG capsule Take 1 capsule (500 mg total) by mouth 3 (three) times daily. 01/12/15   Garlon Hatchet, PA-C  HYDROcodone-acetaminophen (NORCO/VICODIN) 5-325 MG tablet Take 2 tablets by mouth every 4 (four) hours as needed. 07/17/15   Hedges, Tinnie Gens, PA-C  penicillin v potassium (VEETID) 500 MG tablet Take 1 tablet (500 mg total) by mouth 3 (three) times daily. 02/17/15   Fayrene Helper, PA-C    Allergies    Patient has no known allergies.  Review of Systems   Review of Systems  Constitutional:  Positive for chills and fatigue. Negative for fever.  HENT:  Positive for congestion, postnasal  drip, sinus pressure and sinus pain. Negative for trouble swallowing.   Eyes:  Negative for visual disturbance.  Respiratory:  Negative for cough and shortness of breath.   Cardiovascular:  Negative for chest pain, palpitations and leg swelling.  Gastrointestinal:  Negative for abdominal pain and diarrhea.  Genitourinary:  Negative for dysuria.  Musculoskeletal:  Positive for arthralgias and myalgias.  Skin:  Negative for pallor.  Neurological:  Negative for weakness.  Psychiatric/Behavioral:  Negative for confusion.   All other systems reviewed and are negative.  Physical Exam Updated Vital Signs BP 139/78 (BP Location: Right Arm)   Pulse 89   Temp 99.2 F (37.3 C) (Oral)   Resp 18   Ht 5\' 5"  (1.651 m)   Wt 95.3 kg   LMP 10/09/2020 (Approximate)   SpO2 100%   BMI 34.95 kg/m   Physical Exam Vitals and nursing note reviewed.  Constitutional:      General: She is not in acute distress.    Appearance: She is not ill-appearing.  HENT:     Head: Normocephalic and atraumatic.  Eyes:     Conjunctiva/sclera: Conjunctivae normal.  Cardiovascular:     Rate and Rhythm: Normal rate.  Pulmonary:     Effort: Pulmonary effort is normal. No respiratory distress.     Breath sounds: No wheezing.  Abdominal:     General: There is no distension.  Musculoskeletal:  Cervical back: Normal range of motion and neck supple.     Comments: No obvious acute injury  Skin:    General: Skin is warm.  Neurological:     Mental Status: She is alert.     Comments: Awake and alert, answers all questions appropriately.  Speech is not slurred.  Psychiatric:        Mood and Affect: Mood normal.        Behavior: Behavior normal.    ED Results / Procedures / Treatments   Labs (all labs ordered are listed, but only abnormal results are displayed) Labs Reviewed  RESP PANEL BY RT-PCR (FLU A&B, COVID) ARPGX2    EKG None  Radiology No results found.  Procedures Procedures   Medications  Ordered in ED Medications - No data to display  ED Course  I have reviewed the triage vital signs and the nursing notes.  Pertinent labs & imaging results that were available during my care of the patient were reviewed by me and considered in my medical decision making (see chart for details).    MDM Rules/Calculators/A&P                           Kara Coleman was evaluated in Emergency Department on 11/06/2020 for the symptoms described in the history of present illness. She was evaluated in the context of the global COVID-19 pandemic, which necessitated consideration that the patient might be at risk for infection with the SARS-CoV-2 virus that causes COVID-19. Institutional protocols and algorithms that pertain to the evaluation of patients at risk for COVID-19 are in a state of rapid change based on information released by regulatory bodies including the CDC and federal and state organizations. These policies and algorithms were followed during the patient's care in the ED.  Patient is a otherwise healthy 40 year old woman who presents today for evaluation of nasal congestion after a known COVID exposure.  Here she is afebrile, not tachycardic or tachypneic and 100% on room air.  She denies any chest pain, significant shortness of breath, leg swelling, or rashes and primarily has congestion. COVID test is sent. She is informed that she can follow these results in MyChart.  We discussed appropriate isolation and quarantine.  Return precautions were discussed with patient who states their understanding.  At the time of discharge patient denied any unaddressed complaints or concerns.  Patient is agreeable for discharge home.  Note: Portions of this report may have been transcribed using voice recognition software. Every effort was made to ensure accuracy; however, inadvertent computerized transcription errors may be present  Final Clinical Impression(s) / ED Diagnoses Final diagnoses:   Suspected COVID-19 virus infection    Rx / DC Orders ED Discharge Orders     None        Norman Clay 11/06/20 2218    Benjiman Core, MD 11/06/20 2348

## 2022-04-07 ENCOUNTER — Ambulatory Visit
Admission: EM | Admit: 2022-04-07 | Discharge: 2022-04-07 | Disposition: A | Discharge status: Home / Self Care | Payer: No Typology Code available for payment source

## 2022-04-07 DIAGNOSIS — R1031 Right lower quadrant pain: Secondary | ICD-10-CM | POA: Diagnosis not present

## 2022-04-07 NOTE — ED Notes (Signed)
Patient is being discharged from the Urgent Care and sent to the Emergency Department via personal vehicle . Per Provider Lowella Petties, patient is in need of higher level of care due to RLQ abdominal pain. Patient is aware and verbalizes understanding of plan of care.    Vitals:   04/07/22 0927  BP: (!) 161/99  Pulse: 84  Resp: 18  Temp: 97.9 F (36.6 C)  SpO2: 96%

## 2022-04-07 NOTE — Discharge Instructions (Signed)
Due to the extent of your abdominal pain you will need immediate workup as well as possible imaging to determine cause, please go to one of the nearest emergency department for immediate evaluation

## 2022-04-07 NOTE — ED Provider Notes (Signed)
Patient presents for evaluation of right lower abdominal pain present for 3 days.  Pain is constant, fluctuates in intensity and radiates to the right lower back.  Symptoms are worsened by movement causing a sharp pain.  Has attempted use of over-the-counter arthritic medicine which has been ineffective.  Last bowel movement this morning, denies straining or constipation, diarrhea.  Denies abdominal bloating, increased gas production, nausea, vomiting, fevers, urinary symptoms, vaginal symptoms.  Daily tobacco use.  Has all organs.  Tenderness is present over the right lower quadrant and patient has become tearful during the exam when asked to lie back and is holding onto the side of the exam table to brace self from pain.  Due to extent of pain and lack of accompanying symptoms patient is being sent to the nearest emergency department for full abdominal workup, vital signs are stable at this time and she is to escort self.    Hans Eden, NP 04/07/22 1014

## 2022-04-07 NOTE — ED Triage Notes (Signed)
Pt presents with RLQ abdominal pain that radiates around to right lower back X 3 days.

## 2022-05-16 ENCOUNTER — Encounter (HOSPITAL_COMMUNITY): Payer: Self-pay

## 2022-05-16 ENCOUNTER — Other Ambulatory Visit: Payer: Self-pay

## 2022-05-16 ENCOUNTER — Emergency Department (HOSPITAL_COMMUNITY)
Admission: EM | Admit: 2022-05-16 | Discharge: 2022-05-16 | Disposition: A | Discharge status: Home / Self Care | Payer: 59 | Attending: Emergency Medicine | Admitting: Emergency Medicine

## 2022-05-16 DIAGNOSIS — M545 Low back pain, unspecified: Secondary | ICD-10-CM | POA: Insufficient documentation

## 2022-05-16 LAB — URINALYSIS, ROUTINE W REFLEX MICROSCOPIC
Bilirubin Urine: NEGATIVE
Glucose, UA: NEGATIVE mg/dL
Ketones, ur: NEGATIVE mg/dL
Leukocytes,Ua: NEGATIVE
Nitrite: NEGATIVE
Protein, ur: 30 mg/dL — AB
Specific Gravity, Urine: 1.016 (ref 1.005–1.030)
pH: 6 (ref 5.0–8.0)

## 2022-05-16 LAB — COMPREHENSIVE METABOLIC PANEL
ALT: 18 U/L (ref 0–44)
AST: 17 U/L (ref 15–41)
Albumin: 3.8 g/dL (ref 3.5–5.0)
Alkaline Phosphatase: 59 U/L (ref 38–126)
Anion gap: 13 (ref 5–15)
BUN: <5 mg/dL — ABNORMAL LOW (ref 6–20)
CO2: 21 mmol/L — ABNORMAL LOW (ref 22–32)
Calcium: 8.6 mg/dL — ABNORMAL LOW (ref 8.9–10.3)
Chloride: 102 mmol/L (ref 98–111)
Creatinine, Ser: 0.59 mg/dL (ref 0.44–1.00)
GFR, Estimated: >60 mL/min (ref >60)
Glucose, Bld: 89 mg/dL (ref 70–99)
Potassium: 3.3 mmol/L — ABNORMAL LOW (ref 3.5–5.1)
Sodium: 136 mmol/L (ref 135–145)
Total Bilirubin: 0.5 mg/dL (ref 0.3–1.2)
Total Protein: 7.5 g/dL (ref 6.5–8.1)

## 2022-05-16 LAB — CBC
HCT: 37.8 % (ref 36.0–46.0)
Hemoglobin: 12.9 g/dL (ref 12.0–15.0)
MCH: 32.6 pg (ref 26.0–34.0)
MCHC: 34.1 g/dL (ref 30.0–36.0)
MCV: 95.5 fL (ref 80.0–100.0)
Platelets: 263 10*3/uL (ref 150–400)
RBC: 3.96 MIL/uL (ref 3.87–5.11)
RDW: 12.6 % (ref 11.5–15.5)
WBC: 10.3 10*3/uL (ref 4.0–10.5)
nRBC: 0 % (ref 0.0–0.2)

## 2022-05-16 LAB — LIPASE, BLOOD: Lipase: 19 U/L (ref 11–51)

## 2022-05-16 LAB — I-STAT BETA HCG BLOOD, ED (MC, WL, AP ONLY): I-stat hCG, quantitative: <5 m[IU]/mL (ref <5)

## 2022-05-16 MED ORDER — ACETAMINOPHEN 500 MG PO TABS
1000.0000 mg | ORAL_TABLET | Freq: Once | ORAL | Status: AC
  Administered 2022-05-16: 1000 mg via ORAL
  Filled 2022-05-16: qty 2

## 2022-05-16 MED ORDER — DIAZEPAM 5 MG PO TABS
5.0000 mg | ORAL_TABLET | Freq: Once | ORAL | Status: AC
  Administered 2022-05-16: 5 mg via ORAL
  Filled 2022-05-16: qty 1

## 2022-05-16 MED ORDER — KETOROLAC TROMETHAMINE 15 MG/ML IJ SOLN
15.0000 mg | Freq: Once | INTRAMUSCULAR | Status: AC
  Administered 2022-05-16: 15 mg via INTRAMUSCULAR
  Filled 2022-05-16: qty 1

## 2022-05-16 MED ORDER — OXYCODONE HCL 5 MG PO TABS
5.0000 mg | ORAL_TABLET | Freq: Once | ORAL | Status: AC
  Administered 2022-05-16: 5 mg via ORAL
  Filled 2022-05-16: qty 1

## 2022-05-16 NOTE — ED Provider Notes (Signed)
[facility] Provider Note   CSN: [id number] Arrival date & time: 05/16/22  1613     History  No chief complaint on file.   Kara Coleman is a 42 y.o. female.  42 yo F with a chief complaints of right-sided low back pain that radiates around to the abdomen.  This has been going on for a few days.  No injury.  Denies loss of bowel or bladder denies loss of perirectal sensation.  Worse with movement palpation twisting.  She had a similar episode about a month ago.  Was seen in urgent care since then has not followed up with anyone.  Has no PCP but plans to establish one soon.        Home Medications Prior to Admission medications   Medication Sig Start Date End Date Taking? Authorizing Provider  amoxicillin (AMOXIL) 500 MG capsule Take 1 capsule (500 mg total) by mouth 3 (three) times daily. 01/12/15   Larene Pickett, PA-C  HYDROcodone-acetaminophen (NORCO/VICODIN) 5-325 MG tablet Take 2 tablets by mouth every 4 (four) hours as needed. 07/17/15   Hedges, Dellis Filbert, PA-C  penicillin v potassium (VEETID) 500 MG tablet Take 1 tablet (500 mg total) by mouth 3 (three) times daily. 02/17/15   Domenic Moras, PA-C      Allergies    Patient has no known allergies.    Review of Systems   Review of Systems  Physical Exam Updated Vital Signs BP (!) 168/98 (BP Location: Left Arm)   Pulse 92   Temp 97.9 F (36.6 C) (Oral)   Resp 20   Ht 5\' 5"  (1.651 m)   Wt 94.3 kg   SpO2 98%   BMI 34.61 kg/m  Physical Exam Vitals and nursing note reviewed.  Constitutional:      General: She is not in acute distress.    Appearance: She is well-developed. She is not diaphoretic.  HENT:     Head: Normocephalic and atraumatic.  Eyes:     Pupils: Pupils are equal, round, and reactive to light.  Cardiovascular:     Rate and Rhythm: Normal rate and regular rhythm.     Heart sounds: No murmur heard.    No friction rub. No gallop.  Pulmonary:     Effort:  Pulmonary effort is normal.     Breath sounds: No wheezing or rales.  Abdominal:     General: There is no distension.     Palpations: Abdomen is soft.     Tenderness: There is no abdominal tenderness.  Musculoskeletal:        General: No tenderness.     Cervical back: Normal range of motion and neck supple.     Comments: Discomfort with bending and twisting on exam.  No obvious midline spinal tenderness step-offs or deformities.  Pulse motor and sensation intact bilateral lower extremities.  Negative straight leg raise test.  Reflexes 2+ and equal.  No clonus.  Skin:    General: Skin is warm and dry.  Neurological:     Mental Status: She is alert and oriented to person, place, and time.  Psychiatric:        Behavior: Behavior normal.     ED Results / Procedures / Treatments   Labs (all labs ordered are listed, but only abnormal results are displayed) Labs Reviewed  COMPREHENSIVE METABOLIC PANEL - Abnormal; Notable for the following components:      Result Value   Potassium 3.3 (*)  CO2 21 (*)    BUN <5 (*)    Calcium 8.6 (*)    All other components within normal limits  URINALYSIS, ROUTINE W REFLEX MICROSCOPIC - Abnormal; Notable for the following components:   APPearance HAZY (*)    Hgb urine dipstick SMALL (*)    Protein, ur 30 (*)    Bacteria, UA RARE (*)    All other components within normal limits  LIPASE, BLOOD  CBC  I-STAT BETA HCG BLOOD, ED ([facility], [facility], [facility] ONLY)    EKG None  Radiology No results found.  Procedures Procedures    Medications Ordered in ED Medications  acetaminophen (TYLENOL) tablet 1,000 mg (has no administration in time range)  ketorolac (TORADOL) 15 MG/ML injection 15 mg (has no administration in time range)  oxyCODONE (Oxy IR/ROXICODONE) immediate release tablet 5 mg (has no administration in time range)  diazepam (VALIUM) tablet 5 mg (has no administration in time range)    ED Course/ Medical Decision Making/ A&P                              Medical Decision Making Amount and/or Complexity of Data Reviewed Labs: ordered.  Risk OTC drugs. Prescription drug management.   42 yo F with a chief complaints of right-sided back pain.  Worse with movement twisting palpation.  Most likely musculoskeletal by history and physical.  No red flags.  Will treat supportively.  PCP follow-up.   8:53 PM:  I have discussed the diagnosis/risks/treatment options with the patient.  Evaluation and diagnostic testing in the emergency department does not suggest an emergent condition requiring admission or immediate intervention beyond what has been performed at this time.  They will follow up with PCP. We also discussed returning to the ED immediately if new or worsening sx occur. We discussed the sx which are most concerning (e.g., sudden worsening pain, fever, inability to tolerate by mouth) that necessitate immediate return. Medications administered to the patient during their visit and any new prescriptions provided to the patient are listed below.  Medications given during this visit Medications  acetaminophen (TYLENOL) tablet 1,000 mg (has no administration in time range)  ketorolac (TORADOL) 15 MG/ML injection 15 mg (has no administration in time range)  oxyCODONE (Oxy IR/ROXICODONE) immediate release tablet 5 mg (has no administration in time range)  diazepam (VALIUM) tablet 5 mg (has no administration in time range)     The patient appears reasonably screen and/or stabilized for discharge and I doubt any other medical condition or other [facility] requiring further screening, evaluation, or treatment in the ED at this time prior to discharge.          Final Clinical Impression(s) / ED Diagnoses Final diagnoses:  Acute right-sided low back pain without sciatica    Rx / DC Orders ED Discharge Orders     None         Deno Etienne, DO 05/16/22 2053

## 2022-05-16 NOTE — ED Triage Notes (Signed)
Pt came in via POV d/t lower back pain that wraps around her Rt flank area & into her abd. Rates 4/10 pain while in triage, endorses has had hard time pushing BM out last 2 days & her urine looks "lighter," denies painful urination or frequency.

## 2022-05-16 NOTE — Discharge Instructions (Signed)

## 2022-05-30 ENCOUNTER — Emergency Department (HOSPITAL_COMMUNITY): Payer: 59

## 2022-05-30 ENCOUNTER — Emergency Department (HOSPITAL_COMMUNITY)
Admission: EM | Admit: 2022-05-30 | Discharge: 2022-05-30 | Disposition: A | Discharge status: Home / Self Care | Payer: 59 | Attending: Emergency Medicine | Admitting: Emergency Medicine

## 2022-05-30 ENCOUNTER — Encounter (HOSPITAL_COMMUNITY): Payer: Self-pay

## 2022-05-30 DIAGNOSIS — E876 Hypokalemia: Secondary | ICD-10-CM | POA: Insufficient documentation

## 2022-05-30 DIAGNOSIS — N9489 Other specified conditions associated with female genital organs and menstrual cycle: Secondary | ICD-10-CM | POA: Diagnosis not present

## 2022-05-30 DIAGNOSIS — R1031 Right lower quadrant pain: Secondary | ICD-10-CM | POA: Diagnosis present

## 2022-05-30 DIAGNOSIS — M461 Sacroiliitis, not elsewhere classified: Secondary | ICD-10-CM | POA: Insufficient documentation

## 2022-05-30 DIAGNOSIS — E278 Other specified disorders of adrenal gland: Secondary | ICD-10-CM | POA: Insufficient documentation

## 2022-05-30 DIAGNOSIS — K760 Fatty (change of) liver, not elsewhere classified: Secondary | ICD-10-CM | POA: Diagnosis not present

## 2022-05-30 DIAGNOSIS — N83202 Unspecified ovarian cyst, left side: Secondary | ICD-10-CM | POA: Diagnosis not present

## 2022-05-30 DIAGNOSIS — R109 Unspecified abdominal pain: Secondary | ICD-10-CM

## 2022-05-30 LAB — LIPASE, BLOOD: Lipase: 23 U/L (ref 11–51)

## 2022-05-30 LAB — URINALYSIS, ROUTINE W REFLEX MICROSCOPIC
Bilirubin Urine: NEGATIVE
Glucose, UA: NEGATIVE mg/dL
Hgb urine dipstick: NEGATIVE
Ketones, ur: NEGATIVE mg/dL
Leukocytes,Ua: NEGATIVE
Nitrite: NEGATIVE
Protein, ur: NEGATIVE mg/dL
Specific Gravity, Urine: 1.014 (ref 1.005–1.030)
pH: 5 (ref 5.0–8.0)

## 2022-05-30 LAB — CBC WITH DIFFERENTIAL/PLATELET
Abs Immature Granulocytes: 0.03 10*3/uL (ref 0.00–0.07)
Basophils Absolute: 0 10*3/uL (ref 0.0–0.1)
Basophils Relative: 0 %
Eosinophils Absolute: 0.1 10*3/uL (ref 0.0–0.5)
Eosinophils Relative: 1 %
HCT: 39.9 % (ref 36.0–46.0)
Hemoglobin: 13.3 g/dL (ref 12.0–15.0)
Immature Granulocytes: 0 %
Lymphocytes Relative: 24 %
Lymphs Abs: 2.3 10*3/uL (ref 0.7–4.0)
MCH: 32.5 pg (ref 26.0–34.0)
MCHC: 33.3 g/dL (ref 30.0–36.0)
MCV: 97.6 fL (ref 80.0–100.0)
Monocytes Absolute: 0.5 10*3/uL (ref 0.1–1.0)
Monocytes Relative: 6 %
Neutro Abs: 6.5 10*3/uL (ref 1.7–7.7)
Neutrophils Relative %: 69 %
Platelets: 229 10*3/uL (ref 150–400)
RBC: 4.09 MIL/uL (ref 3.87–5.11)
RDW: 12.9 % (ref 11.5–15.5)
WBC: 9.4 10*3/uL (ref 4.0–10.5)
nRBC: 0 % (ref 0.0–0.2)

## 2022-05-30 LAB — COMPREHENSIVE METABOLIC PANEL
ALT: 15 U/L (ref 0–44)
AST: 18 U/L (ref 15–41)
Albumin: 4.2 g/dL (ref 3.5–5.0)
Alkaline Phosphatase: 58 U/L (ref 38–126)
Anion gap: 8 (ref 5–15)
BUN: 7 mg/dL (ref 6–20)
CO2: 24 mmol/L (ref 22–32)
Calcium: 8.7 mg/dL — ABNORMAL LOW (ref 8.9–10.3)
Chloride: 105 mmol/L (ref 98–111)
Creatinine, Ser: 0.56 mg/dL (ref 0.44–1.00)
GFR, Estimated: >60 mL/min (ref >60)
Glucose, Bld: 111 mg/dL — ABNORMAL HIGH (ref 70–99)
Potassium: 3.4 mmol/L — ABNORMAL LOW (ref 3.5–5.1)
Sodium: 137 mmol/L (ref 135–145)
Total Bilirubin: 0.5 mg/dL (ref 0.3–1.2)
Total Protein: 8.2 g/dL — ABNORMAL HIGH (ref 6.5–8.1)

## 2022-05-30 LAB — MAGNESIUM: Magnesium: 1.8 mg/dL (ref 1.7–2.4)

## 2022-05-30 LAB — I-STAT BETA HCG BLOOD, ED (MC, WL, AP ONLY): I-stat hCG, quantitative: <5 m[IU]/mL (ref <5)

## 2022-05-30 MED ORDER — DEXAMETHASONE SODIUM PHOSPHATE 10 MG/ML IJ SOLN
10.0000 mg | Freq: Once | INTRAMUSCULAR | Status: AC
  Administered 2022-05-30: 10 mg via INTRAVENOUS
  Filled 2022-05-30: qty 1

## 2022-05-30 MED ORDER — CYCLOBENZAPRINE HCL 10 MG PO TABS: 10.0000 mg | ORAL_TABLET | Freq: Three times a day (TID) | ORAL | 0 refills | Status: AC | PRN

## 2022-05-30 MED ORDER — SODIUM CHLORIDE (PF) 0.9 % IJ SOLN
INTRAMUSCULAR | Status: AC
  Filled 2022-05-30: qty 50

## 2022-05-30 MED ORDER — IOHEXOL 300 MG/ML  SOLN
100.0000 mL | Freq: Once | INTRAMUSCULAR | Status: AC | PRN
  Administered 2022-05-30: 100 mL via INTRAVENOUS

## 2022-05-30 MED ORDER — IBUPROFEN 600 MG PO TABS: 600.0000 mg | ORAL_TABLET | Freq: Three times a day (TID) | ORAL | 0 refills | Status: AC | PRN

## 2022-05-30 MED ORDER — POTASSIUM CHLORIDE CRYS ER 20 MEQ PO TBCR
40.0000 meq | EXTENDED_RELEASE_TABLET | Freq: Once | ORAL | Status: AC
  Administered 2022-05-30: 40 meq via ORAL
  Filled 2022-05-30: qty 2

## 2022-05-30 NOTE — ED Provider Notes (Signed)
[place] [facility]   CSN: [id number] Arrival date & time: 05/30/22  1031     History  Chief Complaint  Patient presents with   Abdominal Pain   Flank Pain    Kara Coleman is a 42 y.o. female with no past medical history who presents to the ED complaining of abdominal pain.  She states that this pain for started in February 2024 and she was evaluated at urgent care at that time but discharged home with no findings.  She says that pain lasted for several weeks and then she was evaluated in the ED in March 2024 where she had blood work and urine which was negative.  Pain did resolve for some time but started back on 3/29 and has been persistent since that time.  She describes it as both bilateral lower quadrant pain and lower back pain but worse to the right flank.  No history of kidney problems or kidney stones.  No prior abdominal surgeries.  No associated nausea, vomiting, diarrhea, fever, chills, chest pain, shortness of breath, or urinary symptoms.  Notes at times she does feel constipated and has irregular bowel movements.  Last bowel movement was this morning but was small in caliber.  She does not currently have a primary care provider.  She has not previously had imaging since onset of this pain.  Describes it as worse when she stretches out her arms and most noticeable to lower back.  No vaginal bleeding, vaginal discharge, or pelvic pain.    Home Medications Prior to Admission medications   Medication Sig Start Date End Date Taking? Authorizing Provider  cyclobenzaprine (FLEXERIL) 10 MG tablet Take 1 tablet (10 mg total) by mouth 3 (three) times daily as needed for up to 5 days for muscle spasms. 05/30/22 06/04/22 Yes Andra Heslin L, PA-C  ibuprofen (ADVIL) 600 MG tablet Take 1 tablet (600 mg total) by mouth every 8 (eight) hours as needed for up to 5 days for mild pain or moderate pain. 05/30/22 06/04/22 Yes Gabrial Poppell L, PA-C   amoxicillin (AMOXIL) 500 MG capsule Take 1 capsule (500 mg total) by mouth 3 (three) times daily. 01/12/15   Garlon HatchetSanders, Lisa M, PA-C  HYDROcodone-acetaminophen (NORCO/VICODIN) 5-325 MG tablet Take 2 tablets by mouth every 4 (four) hours as needed. 07/17/15   Hedges, Tinnie GensJeffrey, PA-C  penicillin v potassium (VEETID) 500 MG tablet Take 1 tablet (500 mg total) by mouth 3 (three) times daily. 02/17/15   Fayrene Helperran, Bowie, PA-C      Allergies    Patient has no known allergies.    Review of Systems   Review of Systems  All other systems reviewed and are negative.   Physical Exam Updated Vital Signs BP (!) 157/100 (BP Location: Left Arm)   Pulse 68   Temp 98.3 F (36.8 C)   Resp 16   Ht 5\' 5"  (1.651 m)   Wt 93 kg   LMP 05/08/2022   SpO2 99%   BMI 34.11 kg/m  Physical Exam Vitals and nursing note reviewed.  Constitutional:      General: She is not in acute distress.    Appearance: Normal appearance. She is not ill-appearing, toxic-appearing or diaphoretic.  HENT:     Head: Normocephalic and atraumatic.     Mouth/Throat:     Mouth: Mucous membranes are moist.  Eyes:     General: No scleral icterus.    Extraocular Movements: Extraocular movements intact.  Conjunctiva/sclera: Conjunctivae normal.     Pupils: Pupils are equal, round, and reactive to light.  Neck:     Comments: No meningismus Cardiovascular:     Rate and Rhythm: Normal rate and regular rhythm.     Heart sounds: No murmur heard. Pulmonary:     Effort: Pulmonary effort is normal. No respiratory distress.     Breath sounds: Normal breath sounds. No stridor. No wheezing, rhonchi or rales.  Chest:     Chest wall: No tenderness.  Abdominal:     General: Abdomen is flat. There is no distension.     Palpations: Abdomen is soft. There is no shifting dullness, fluid wave, hepatomegaly, splenomegaly, mass or pulsatile mass.     Tenderness: There is no abdominal tenderness. There is no right CVA tenderness, left CVA tenderness,  guarding or rebound. Negative signs include Murphy's sign, Rovsing's sign and McBurney's sign.     Hernia: No hernia is present.  Musculoskeletal:        General: Normal range of motion.     Cervical back: Normal range of motion and neck supple.     Right lower leg: No edema.     Left lower leg: No edema.     Comments: No midline spinal tenderness, stepoffs, or deformities  Skin:    General: Skin is warm and dry.     Capillary Refill: Capillary refill takes less than 2 seconds.     Coloration: Skin is not pale.     Findings: No rash.  Neurological:     General: No focal deficit present.     Mental Status: She is alert and oriented to person, place, and time.     GCS: GCS eye subscore is 4. GCS verbal subscore is 5. GCS motor subscore is 6.     Cranial Nerves: Cranial nerves 2-12 are intact.     Motor: Motor function is intact.     Gait: Gait is intact.  Psychiatric:        Mood and Affect: Mood normal.        Behavior: Behavior normal.     ED Results / Procedures / Treatments   Labs (all labs ordered are listed, but only abnormal results are displayed) Labs Reviewed  COMPREHENSIVE METABOLIC PANEL - Abnormal; Notable for the following components:      Result Value   Potassium 3.4 (*)    Glucose, Bld 111 (*)    Calcium 8.7 (*)    Total Protein 8.2 (*)    All other components within normal limits  URINALYSIS, ROUTINE W REFLEX MICROSCOPIC - Abnormal; Notable for the following components:   APPearance HAZY (*)    All other components within normal limits  CBC WITH DIFFERENTIAL/PLATELET  LIPASE, BLOOD  MAGNESIUM  I-STAT BETA HCG BLOOD, ED ([facility], [facility], [facility] ONLY)    EKG NSR at 83 bpm, no acute ST-T changes, no STEMI  Radiology CT Abdomen Pelvis W Contrast  Result Date: 05/30/2022 CLINICAL DATA:  Abdominal pain. EXAM: CT ABDOMEN AND PELVIS WITH CONTRAST TECHNIQUE: Multidetector CT imaging of the abdomen and pelvis was performed using the standard protocol following bolus  administration of intravenous contrast. RADIATION DOSE REDUCTION: This exam was performed according to the departmental dose-optimization program which includes automated exposure control, adjustment of the mA and/or kV according to patient size and/or use of iterative reconstruction technique. CONTRAST:  OMNIPAQUE IOHEXOL 300 MG/ML  SOLN COMPARISON:  None Available. FINDINGS: Lower chest: No acute abnormality. Hepatobiliary: There is subjective hepatic  steatosis. No suspicious liver abnormality identified. Gallbladder appears normal. No bile duct dilatation. Pancreas: Unremarkable. No pancreatic ductal dilatation or surrounding inflammatory changes. Spleen: Normal in size without focal abnormality. Adrenals/Urinary Tract: Within the left adrenal gland there is a nodule which measures 23.6 Hounsfield units and 2.4 cm, image 15/2. Normal right adrenal gland. No nephrolithiasis or hydronephrosis. No suspicious kidney mass. Urinary bladder appears normal. Stomach/Bowel: Stomach is within normal limits. The appendix is visualized and appears normal. No pathologic dilatation of the large or small bowel loops. The sigmoid colon and rectum appear decompressed. Mild wall thickening of the sigmoid colon likely reflects incomplete distension. No surrounding inflammatory fat stranding identified. Vascular/Lymphatic: No significant vascular findings are present. No enlarged abdominal or pelvic lymph nodes. Reproductive: Uterus appears normal. Left ovary cyst measures 1.9 cm. Increased, left-sided parametrial vascularity is identified. Other: No free fluid or fluid collection within the abdomen or pelvis. No signs of pneumoperitoneum. Musculoskeletal: There is increased sclerosis along both sides of bilateral SI joints. No acute or suspicious osseous findings. IMPRESSION: 1. No acute findings identified within the abdomen or pelvis. 2. Mild wall thickening of the sigmoid colon likely reflects incomplete distension. No  surrounding inflammatory fat stranding identified. 3. Increased, left-sided parametrial vascularity which can be seen with pelvic congestion syndrome. 4. Left adrenal mass measuring 2.4 cm, probable benign adenoma. Recommend nonemergent adrenal washout CT or chemical shift MRI. JACR 2017 Aug; 14(8):1038-44, JCAT 2016 Mar-Apr; 40(2):194-200, Urol J 2006 Spring; 3(2):71-4. 5. Hepatic steatosis. 6. Left ovary cyst measures 1.9 cm. No follow-up imaging recommended. Note: This recommendation does not apply to premenarchal patients and to those with increased risk (genetic, family history, elevated tumor markers or other high-risk factors) of ovarian cancer. Reference: JACR 2020 Feb; 17(2):248-254 7. Increased sclerosis about bilateral SI joints compatible with sacroiliitis. Electronically Signed   By: Signa Kell M.D.   On: 05/30/2022 14:45    Procedures Procedures   Medications Ordered in ED Medications  iohexol (OMNIPAQUE) 300 MG/ML solution 100 mL (100 mLs Intravenous Contrast Given 05/30/22 1351)  dexamethasone (DECADRON) injection 10 mg (10 mg Intravenous Given 05/30/22 1556)  potassium chloride SA (KLOR-CON M) CR tablet 40 mEq (40 mEq Oral Given 05/30/22 1556)    ED Course/ Medical Decision Making/ A&P                           Medical Decision Making Amount and/or Complexity of Data Reviewed Labs: ordered. Decision-making details documented in ED Course. Radiology: ordered. Decision-making details documented in ED Course.  Risk Prescription drug management.   Medical Decision Making:   Kara Coleman is a 42 y.o. female who presented to the ED today with abdominal pain detailed above.    Patient's presentation is complicated by their history of no established primary care.  Complete initial physical exam performed, notably the patient  was in no acute distress. Abdomen soft, nontender, non-distended, and without peritoneal signs. Does not appear dehydrated. Hypertensive but vital signs  stable. No midline spinal tenderness, stepoffs, or deformities. Neurologically intact.     Reviewed and confirmed nursing documentation for past medical history, family history, social history.    Initial Assessment:   With the patient's presentation of abdominal pain, differential diagnosis includes but is not limited to AAA, mesenteric ischemia, appendicitis, diverticulitis, DKA, gastritis, gastroenteritis, AMI, nephrolithiasis, pancreatitis, peritonitis, adrenal insufficiency, intestinal ischemia, constipation, UTI, SBO/LBO, splenic rupture, biliary disease, IBD, IBS, PUD, hepatitis, STD, ovarian/testicular torsion, electrolyte disturbance, DKA, dehydration, acute  kidney injury, renal failure, cholecystitis, cholelithiasis, choledocholithiasis, abdominal pain of  unknown etiology, pregnancy, incomplete abortion, septic abortion, threatened abortion, ectopic pregnancy, PID.   Initial Plan:  Screening labs including CBC and Metabolic panel to evaluate for infectious or metabolic etiology of disease.  Lipase to evaluate for pancreatitis Urinalysis with reflex culture ordered to evaluate for UTI or relevant urologic/nephrologic pathology.  CT abd/pelvis to evaluate for intra-abdominal pathology EKG to evaluate for cardiac pathology Symptomatic management - pt declined Objective evaluation as reviewed   Initial Study Results:   Laboratory  All laboratory results reviewed without evidence of clinically relevant pathology.   Exceptions include: K 3,4   EKG EKG was reviewed independently. ST segments without concerns for elevations.   EKG: normal EKG, normal sinus rhythm.   Radiology:  All images reviewed independently. Agree with radiology report at this time.   CT Abdomen Pelvis W Contrast  Result Date: 05/30/2022 CLINICAL DATA:  Abdominal pain. EXAM: CT ABDOMEN AND PELVIS WITH CONTRAST TECHNIQUE: Multidetector CT imaging of the abdomen and pelvis was performed using the standard protocol  following bolus administration of intravenous contrast. RADIATION DOSE REDUCTION: This exam was performed according to the departmental dose-optimization program which includes automated exposure control, adjustment of the mA and/or kV according to patient size and/or use of iterative reconstruction technique. CONTRAST:  OMNIPAQUE IOHEXOL 300 MG/ML  SOLN COMPARISON:  None Available. FINDINGS: Lower chest: No acute abnormality. Hepatobiliary: There is subjective hepatic steatosis. No suspicious liver abnormality identified. Gallbladder appears normal. No bile duct dilatation. Pancreas: Unremarkable. No pancreatic ductal dilatation or surrounding inflammatory changes. Spleen: Normal in size without focal abnormality. Adrenals/Urinary Tract: Within the left adrenal gland there is a nodule which measures 23.6 Hounsfield units and 2.4 cm, image 15/2. Normal right adrenal gland. No nephrolithiasis or hydronephrosis. No suspicious kidney mass. Urinary bladder appears normal. Stomach/Bowel: Stomach is within normal limits. The appendix is visualized and appears normal. No pathologic dilatation of the large or small bowel loops. The sigmoid colon and rectum appear decompressed. Mild wall thickening of the sigmoid colon likely reflects incomplete distension. No surrounding inflammatory fat stranding identified. Vascular/Lymphatic: No significant vascular findings are present. No enlarged abdominal or pelvic lymph nodes. Reproductive: Uterus appears normal. Left ovary cyst measures 1.9 cm. Increased, left-sided parametrial vascularity is identified. Other: No free fluid or fluid collection within the abdomen or pelvis. No signs of pneumoperitoneum. Musculoskeletal: There is increased sclerosis along both sides of bilateral SI joints. No acute or suspicious osseous findings. IMPRESSION: 1. No acute findings identified within the abdomen or pelvis. 2. Mild wall thickening of the sigmoid colon likely reflects incomplete  distension. No surrounding inflammatory fat stranding identified. 3. Increased, left-sided parametrial vascularity which can be seen with pelvic congestion syndrome. 4. Left adrenal mass measuring 2.4 cm, probable benign adenoma. Recommend nonemergent adrenal washout CT or chemical shift MRI. JACR 2017 Aug; 14(8):1038-44, JCAT 2016 Mar-Apr; 40(2):194-200, Urol J 2006 Spring; 3(2):71-4. 5. Hepatic steatosis. 6. Left ovary cyst measures 1.9 cm. No follow-up imaging recommended. Note: This recommendation does not apply to premenarchal patients and to those with increased risk (genetic, family history, elevated tumor markers or other high-risk factors) of ovarian cancer. Reference: JACR 2020 Feb; 17(2):248-254 7. Increased sclerosis about bilateral SI joints compatible with sacroiliitis. Electronically Signed   By: Signa Kell M.D.   On: 05/30/2022 14:45     Final Assessment and Plan:   This is a 42 year old female who presents to ED for evaluation of abdominal pain.  Somewhat chronic in nature, occurring multiple times over last couple of months. Some associated lower back pain but no other symptoms. No identifiable injury. Has had normal blood work and urine at urgent care and in ED. No prior imaging. Possibly some constipation but did have last bowel movement this morning. Describes pain as greatest to lower back/flanks with extending arms. Suspect musculoskeletal in nature but pt does express significant concern about symptoms and with no prior imaging, no established primary care or outpatient medical team, discussed risks/benefits of evaluation today including blood work, urine, CT with pt who would like to proceed with imaging. Workup obtained as above for further assessment. Pt declines pain medication in ED. Blood work essentially unremarkable apart from very mild hypokalemia which was repleted. Remainder unremarkable. Urine does not appear acutely infected. No reproducible abdominal tenderness on exam.  No peritoneal signs. Pt denies pelvic pain, bleeding or discharge, concern for STDs, low suspicion for PID and pt ok with foregoing pelvic exam and STD testing today in absence of these symptoms. Overall, CT also reassuring. Multiple findings that appear incidental. All findings discussed in great detail with pt. No emergent cause of her recurring abdominal pain identified. Question if it is related to sacroiliitis as noted on CT and musculoskeletal in nature. Will give dose of Decadron here and start on NSAIDs/muscle relaxers on home to trial for relief in addition to back strengthening exercises. Encouraged pt to closely follow up with primary care for further evaluation, possible specialist referral, and management of any chronic conditions. Provided with primary care follow up and copy of radiology report. Pt expressed significant gratitude for care given today. She has remained stable. All questions answered, strict ED return precautions given, and pt agreeable with plan for discharge home.    Clinical Impression:  1. Bilateral sacroiliitis   2. Pelvic congestion syndrome   3. Left adrenal mass   4. Hepatic steatosis   5. Left ovarian cyst   6. Abdominal pain, unspecified abdominal location   7. Hypokalemia      Discharge    Final Clinical Impression(s) / ED Diagnoses Final diagnoses:  Pelvic congestion syndrome  Left adrenal mass  Hepatic steatosis  Left ovarian cyst  Bilateral sacroiliitis  Abdominal pain, unspecified abdominal location  Hypokalemia    Rx / DC Orders ED Discharge Orders          Ordered    cyclobenzaprine (FLEXERIL) 10 MG tablet  3 times daily PRN        05/30/22 1524    ibuprofen (ADVIL) 600 MG tablet  Every 8 hours PRN        05/30/22 1524              Richardson Dopp 05/30/22 Rossie Muskrat, MD 05/31/22 [phone]

## 2022-05-30 NOTE — Discharge Instructions (Addendum)
Thank you for letting us take care of you today.   Overall, your workup was reassuring. We did have some incidental findings as we discussed. I do think your pain is mostly related to the sacroiliitis finding on CT today.  This is typically treated conservatively with NSAIDs and stretching.  Please see attached exercises to help you rehabilitate at home.  We also gave you a dose of steroids in the ED to help with this and I am prescribing a short course of muscle relaxers that will hopefully help as well. Please take these as well as the NSAIDs as prescribed. You may take over the counter Tylenol on top of this.   Your potassium was slightly low. We gave you a pill to replace this. Please see instructions for ways to increase potassium in your diet.   I do believe you would benefit from primary care follow up. I provided 2 clinics you may contact to establish primary care. Please call them and schedule a follow up appointment. If you would like a separate OB/GYN, I have also provided this follow up.   I have attached your CT findings below for your reference. For any new or worsening symptoms, please return to nearest emergency department for re-evaluation.   Narrative & Impression  CLINICAL DATA:  Abdominal pain.   EXAM: CT ABDOMEN AND PELVIS WITH CONTRAST   TECHNIQUE: Multidetector CT imaging of the abdomen and pelvis was performed using the standard protocol following bolus administration of intravenous contrast.   RADIATION DOSE REDUCTION: This exam was performed according to the departmental dose-optimization program which includes automated exposure control, adjustment of the mA and/or kV according to patient size and/or use of iterative reconstruction technique.   CONTRAST:  OMNIPAQUE IOHEXOL 300 MG/ML  SOLN   COMPARISON:  None Available.   FINDINGS: Lower chest: No acute abnormality.   Hepatobiliary: There is subjective hepatic steatosis. No suspicious liver  abnormality identified. Gallbladder appears normal. No bile duct dilatation.   Pancreas: Unremarkable. No pancreatic ductal dilatation or surrounding inflammatory changes.   Spleen: Normal in size without focal abnormality.   Adrenals/Urinary Tract: Within the left adrenal gland there is a nodule which measures 23.6 Hounsfield units and 2.4 cm, image 15/2. Normal right adrenal gland. No nephrolithiasis or hydronephrosis. No suspicious kidney mass. Urinary bladder appears normal.   Stomach/Bowel: Stomach is within normal limits. The appendix is visualized and appears normal. No pathologic dilatation of the large or small bowel loops. The sigmoid colon and rectum appear decompressed. Mild wall thickening of the sigmoid colon likely reflects incomplete distension. No surrounding inflammatory fat stranding identified.   Vascular/Lymphatic: No significant vascular findings are present. No enlarged abdominal or pelvic lymph nodes.   Reproductive: Uterus appears normal. Left ovary cyst measures 1.9 cm. Increased, left-sided parametrial vascularity is identified.   Other: No free fluid or fluid collection within the abdomen or pelvis. No signs of pneumoperitoneum.   Musculoskeletal: There is increased sclerosis along both sides of bilateral SI joints. No acute or suspicious osseous findings.   IMPRESSION: 1. No acute findings identified within the abdomen or pelvis. 2. Mild wall thickening of the sigmoid colon likely reflects incomplete distension. No surrounding inflammatory fat stranding identified. 3. Increased, left-sided parametrial vascularity which can be seen with pelvic congestion syndrome. 4. Left adrenal mass measuring 2.4 cm, probable benign adenoma. Recommend nonemergent adrenal washout CT or chemical shift MRI. JACR 2017 Aug; 14(8):1038-44, JCAT 2016 Mar-Apr; 40(2):194-200, Urol J 2006 Spring; 3(2):71-4. 5. Hepatic steatosis. 6.  Left ovary cyst measures 1.9 cm. No  follow-up imaging recommended. Note: This recommendation does not apply to premenarchal patients and to those with increased risk (genetic, family history, elevated tumor markers or other high-risk factors) of ovarian cancer. Reference: JACR 2020 Feb; 17(2):248-254 7. Increased sclerosis about bilateral SI joints compatible with sacroiliitis.     Electronically Signed   By: Signa Kell M.D.   On: 05/30/2022 14:45

## 2022-05-30 NOTE — ED Triage Notes (Addendum)
Pt c/o lower abd pain, more on right than left, that wraps around to her flank. Pt denies any urinary issues- states it has been "lighter than normal". Pt c/o some constipation- last BM this morning. Pt has been taking ibuprofen with some relief. Pt states this has been going on since February. Pt is concerned for a hernia. Pain worse w movement Denies nausea, vomiting.
